# Patient Record
Sex: Female | Born: 1990 | Race: White | Hispanic: No | Marital: Single | Smoking: Current every day smoker
Health system: Southern US, Community
[De-identification: ages and names within clinical notes are randomized; demographics above are authoritative.]

## PROBLEM LIST (undated history)

## (undated) DIAGNOSIS — F319 Bipolar disorder, unspecified: Secondary | ICD-10-CM

## (undated) DIAGNOSIS — B1920 Unspecified viral hepatitis C without hepatic coma: Secondary | ICD-10-CM

## (undated) HISTORY — PX: OTHER SURGICAL HISTORY: SHX169

---

## 2011-01-02 ENCOUNTER — Emergency Department (INDEPENDENT_AMBULATORY_CARE_PROVIDER_SITE_OTHER): Payer: Medicaid Other

## 2011-01-02 ENCOUNTER — Emergency Department (HOSPITAL_BASED_OUTPATIENT_CLINIC_OR_DEPARTMENT_OTHER)
Admission: EM | Admit: 2011-01-02 | Discharge: 2011-01-02 | Disposition: A | Discharge status: Home / Self Care | Payer: Medicaid Other | Attending: Emergency Medicine | Admitting: Emergency Medicine

## 2011-01-02 DIAGNOSIS — F17200 Nicotine dependence, unspecified, uncomplicated: Secondary | ICD-10-CM | POA: Insufficient documentation

## 2011-01-02 DIAGNOSIS — F319 Bipolar disorder, unspecified: Secondary | ICD-10-CM | POA: Insufficient documentation

## 2011-01-02 DIAGNOSIS — S99919A Unspecified injury of unspecified ankle, initial encounter: Secondary | ICD-10-CM

## 2011-01-02 DIAGNOSIS — S93409A Sprain of unspecified ligament of unspecified ankle, initial encounter: Secondary | ICD-10-CM

## 2011-01-02 DIAGNOSIS — X500XXA Overexertion from strenuous movement or load, initial encounter: Secondary | ICD-10-CM | POA: Insufficient documentation

## 2011-01-02 DIAGNOSIS — X58XXXA Exposure to other specified factors, initial encounter: Secondary | ICD-10-CM

## 2011-01-02 DIAGNOSIS — S99929A Unspecified injury of unspecified foot, initial encounter: Secondary | ICD-10-CM

## 2011-01-02 HISTORY — DX: Bipolar disorder, unspecified: F31.9

## 2011-01-02 MED ORDER — OXYCODONE-ACETAMINOPHEN 5-325 MG PO TABS: 2.0000 | ORAL_TABLET | ORAL | Status: AC | PRN

## 2011-01-02 NOTE — ED Notes (Signed)
XR performed and awaiting MD evaluation.  No change in pt assessment.

## 2011-01-02 NOTE — ED Provider Notes (Signed)
History     Chief Complaint  Patient presents with  . Foot Injury  . Ankle Pain   Patient is a 20 y.o. female presenting with foot injury and ankle pain. The history is provided by the patient.  Foot Injury  The incident occurred 3 to 5 hours ago. The incident occurred at home. The injury mechanism was torsion. The pain is present in the right ankle and right foot. The quality of the pain is described as sharp and throbbing. The pain is moderate. The pain has been constant since onset. She reports no foreign bodies present. The symptoms are aggravated by nothing. She has tried nothing for the symptoms.  Ankle Pain    Pt reports she was playing in the yard with her children and she turned her ankle.  Pt complains of pain to lateral aspect of ankle.  Past Medical History  Diagnosis Date  . Bipolar 1 disorder     Past Surgical History  Procedure Date  . Laproscopy     History reviewed. No pertinent family history.  History  Substance Use Topics  . Smoking status: Current Everyday Smoker -- 0.5 packs/day  . Smokeless tobacco: Not on file  . Alcohol Use: Yes     occassional    OB History    Grav Para Term Preterm Abortions TAB SAB Ect Mult Living                  Review of Systems  Musculoskeletal: Positive for myalgias and joint swelling.  All other systems reviewed and are negative.    Physical Exam  BP 140/72  Pulse 72  Temp(Src) 97.8 F (36.6 C) (Oral)  Resp 16  Ht 5\' 7"  (1.702 m)  Wt 150 lb (68.04 kg)  BMI 23.49 kg/m2  SpO2 100%  LMP 11/14/2010  Physical Exam  Constitutional: She is oriented to person, place, and time. She appears well-developed and well-nourished.  HENT:  Head: Normocephalic.  Musculoskeletal: She exhibits edema and tenderness.  Neurological: She is alert and oriented to person, place, and time.  Skin: There is erythema.  Psychiatric: She has a normal mood and affect.    ED Course  Procedures  MDM Xray no fx.  Pt advised  followup with Dr. Pearletha Forge.        Langston Masker, [place] 01/02/11 [phone]

## 2011-01-02 NOTE — ED Notes (Signed)
Pt reports she was playing ball with her kids and "rolled" ger ankle.  Pain has increased in right foot and ankle throughout the day.

## 2011-02-14 NOTE — ED Provider Notes (Signed)
History     CSN: [id number] Arrival date & time: 01/02/2011  4:00 PM  Chief Complaint  Patient presents with  . Foot Injury  . Ankle Pain   HPI  Past Medical History  Diagnosis Date  . Bipolar 1 disorder     Past Surgical History  Procedure Date  . Laproscopy     History reviewed. No pertinent family history.  History  Substance Use Topics  . Smoking status: Current Everyday Smoker -- 0.5 packs/day  . Smokeless tobacco: Not on file  . Alcohol Use: Yes     occassional    OB History    Grav Para Term Preterm Abortions TAB SAB Ect Mult Living                  Review of Systems  Physical Exam  BP 140/72  Pulse 72  Temp(Src) 97.8 F (36.6 C) (Oral)  Resp 16  Ht 5\' 7"  (1.702 m)  Wt 150 lb (68.04 kg)  BMI 23.49 kg/m2  SpO2 100%  LMP 11/14/2010  Physical Exam  ED Course  Procedures  MDM Medical screening examination/treatment/procedure(s) were performed by non-physician practitioner and as supervising physician I was immediately available for consultation/collaboration.       Nat Christen, MD 02/14/11 [phone]

## 2013-02-21 IMAGING — CR DG ANKLE COMPLETE 3+V*R*
3 series · 3 of 3 positions shown · non-contrast
Comparison: None

CLINICAL DATA: Injured right ankle.

RIGHT ANKLE - COMPLETE 3+ VIEW

[t ankle joint lat right]
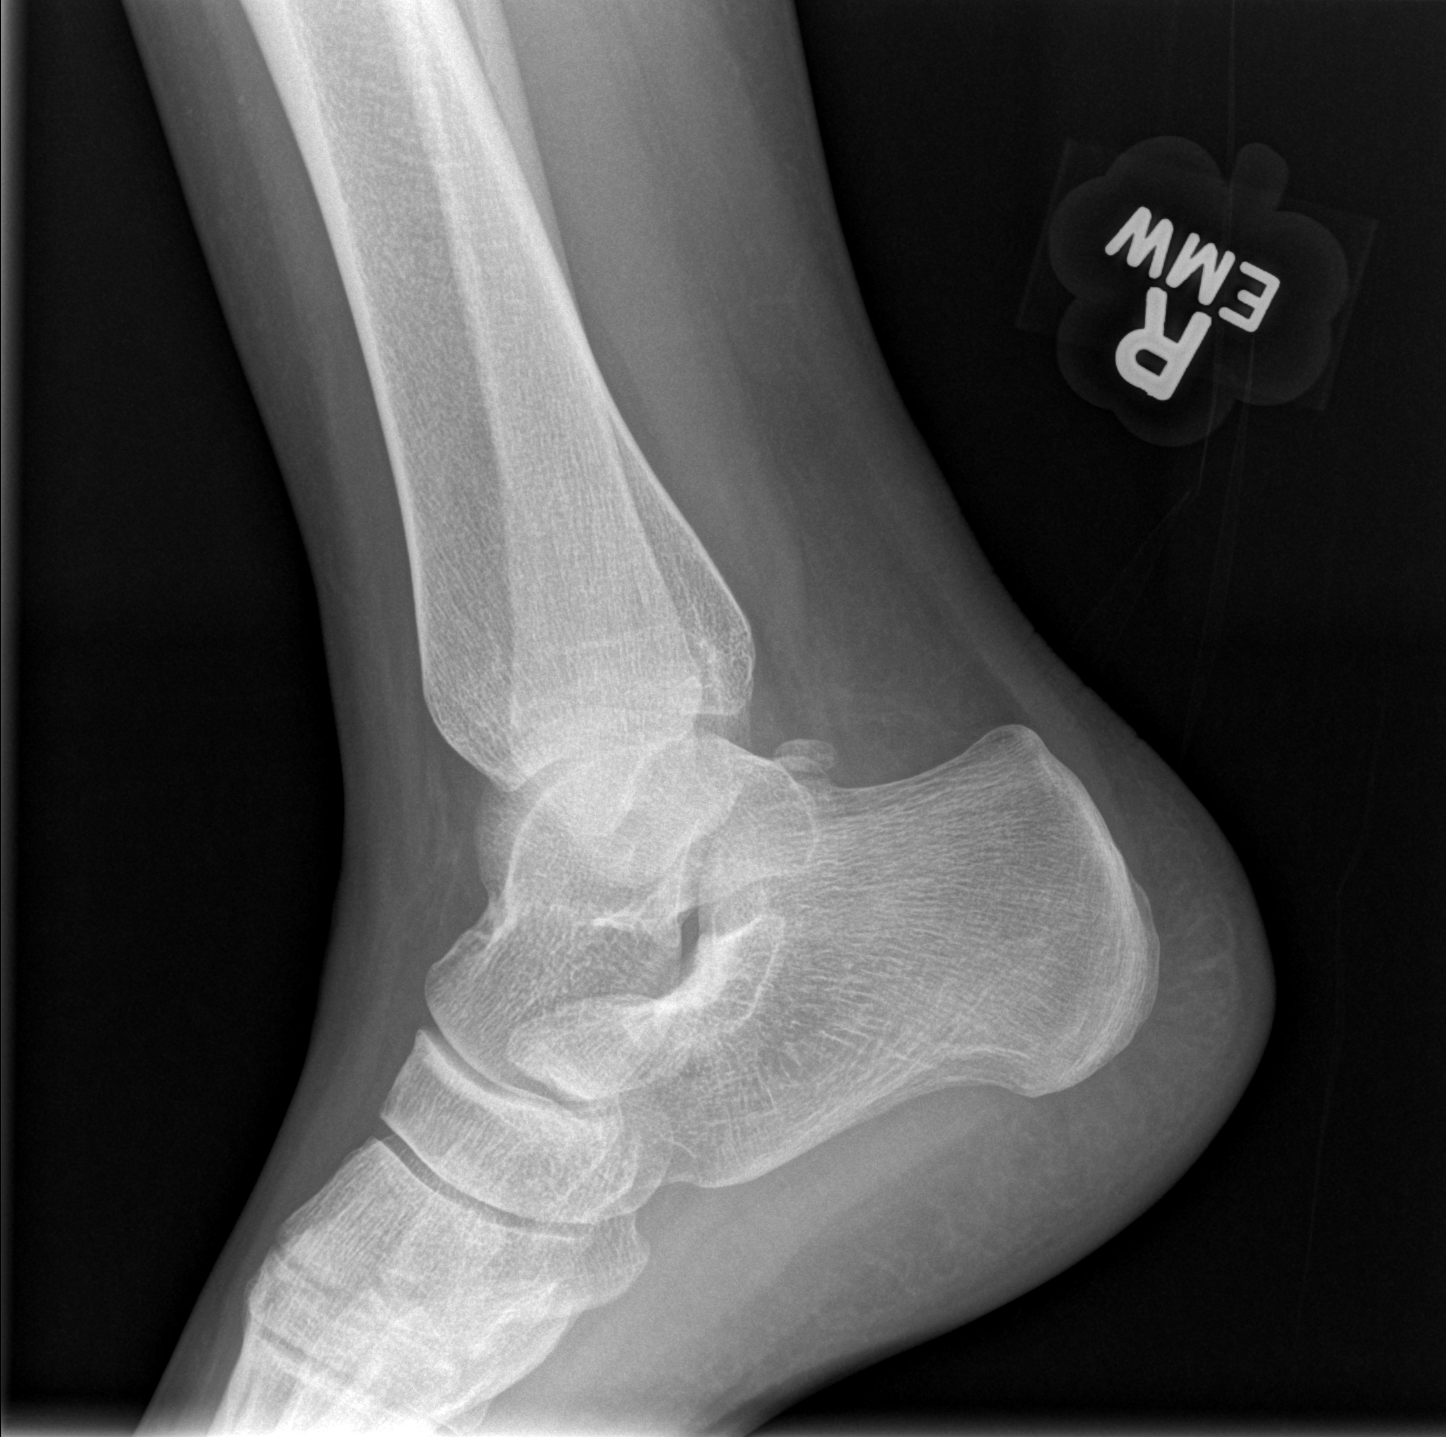

[t ankle joint ap right]
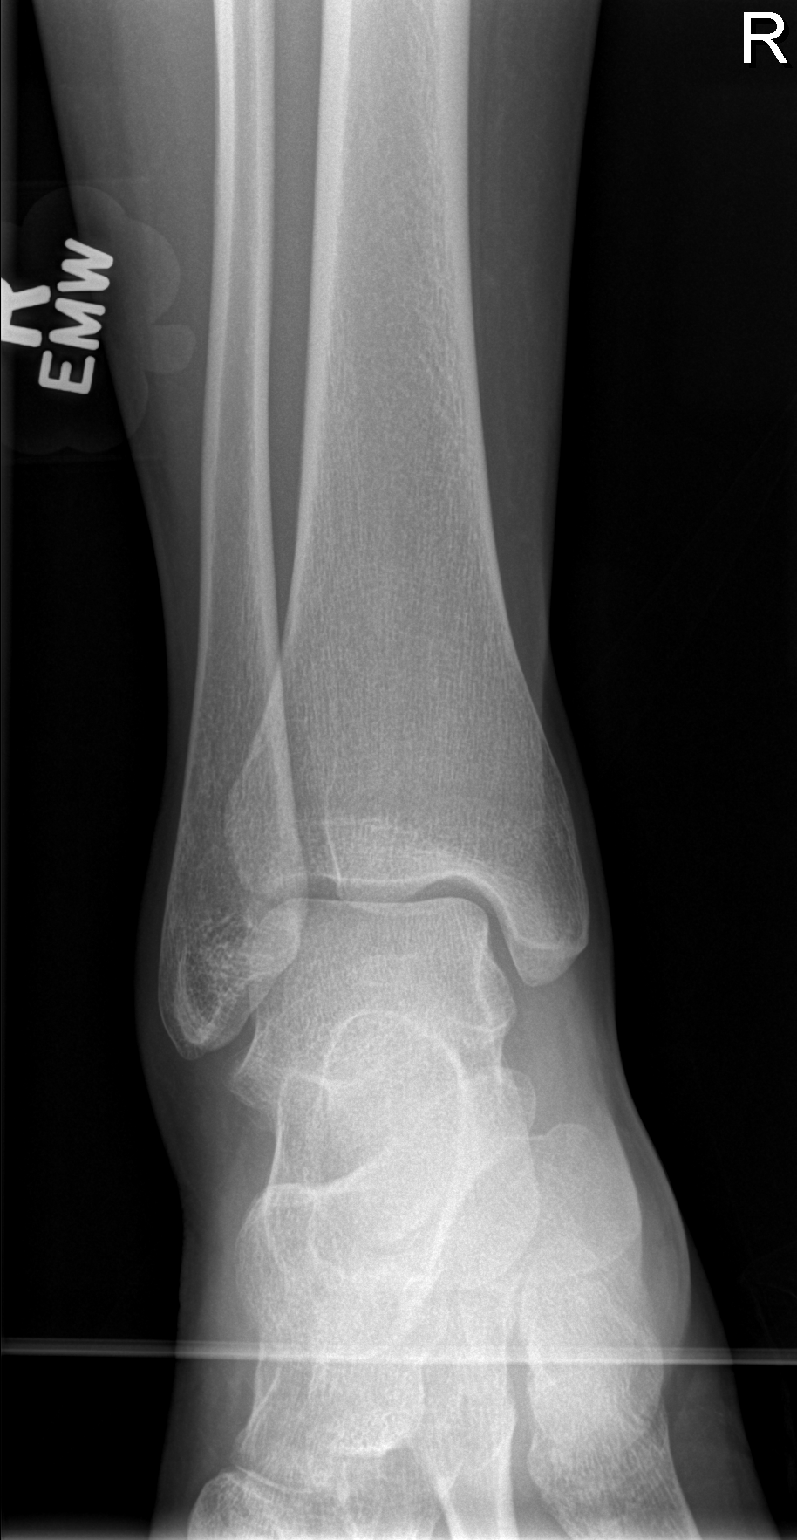

[t ankle joint oblique right]
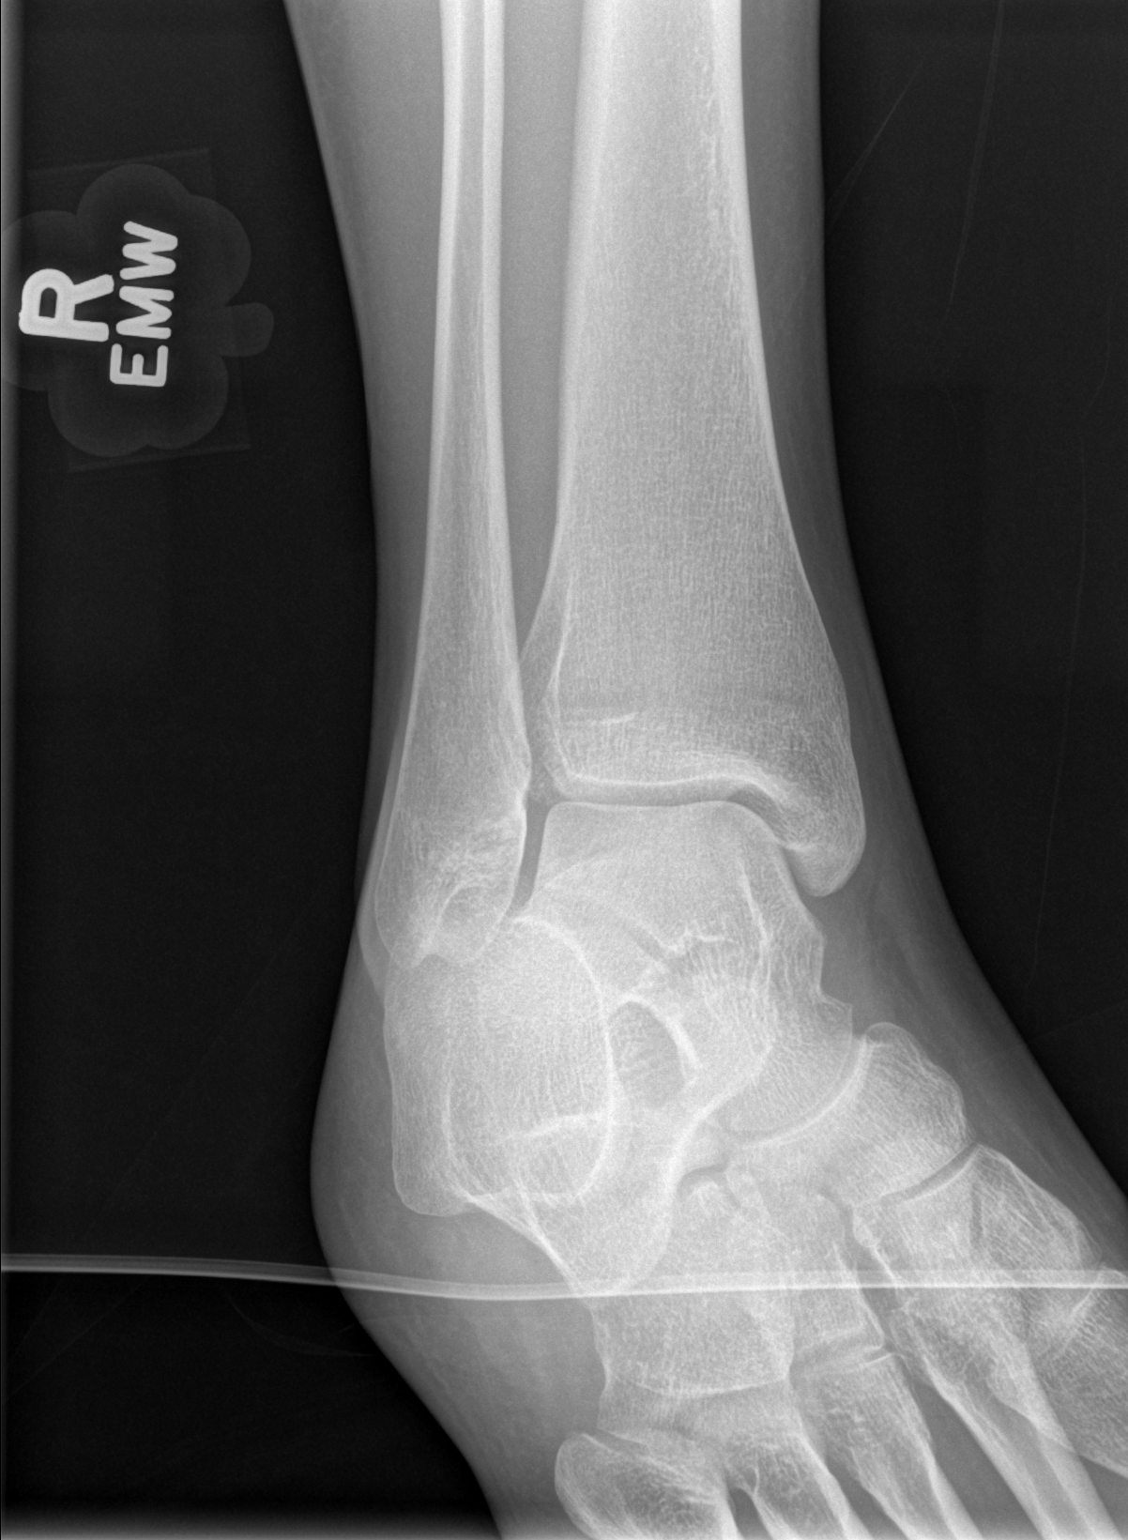

[3 of 3 positions shown; findings below may reference images not displayed]

FINDINGS: The ankle mortise is maintained.  No acute fracture or
osteochondral abnormality.
IMPRESSION: No acute bony findings.

## 2013-02-21 IMAGING — CR DG FOOT COMPLETE 3+V*R*
3 series · 3 of 3 positions shown · non-contrast
Comparison: None

CLINICAL DATA: Injured right foot.

RIGHT FOOT COMPLETE - 3+ VIEW

[t foot ap right]
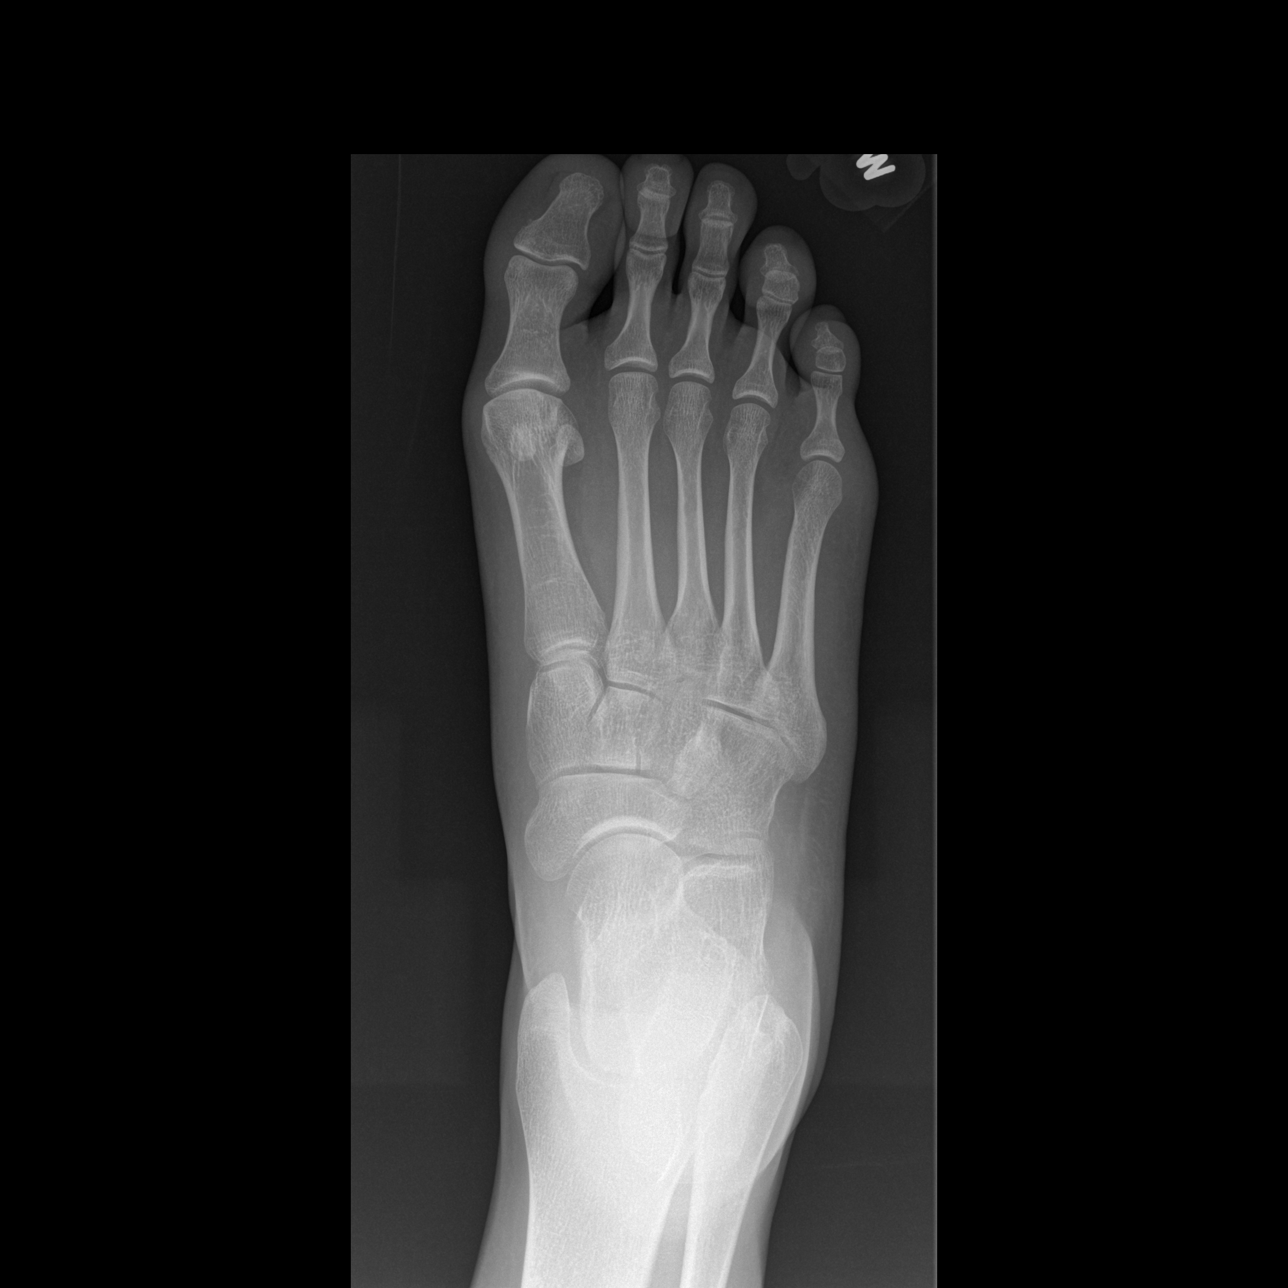

[t foot oblique right]
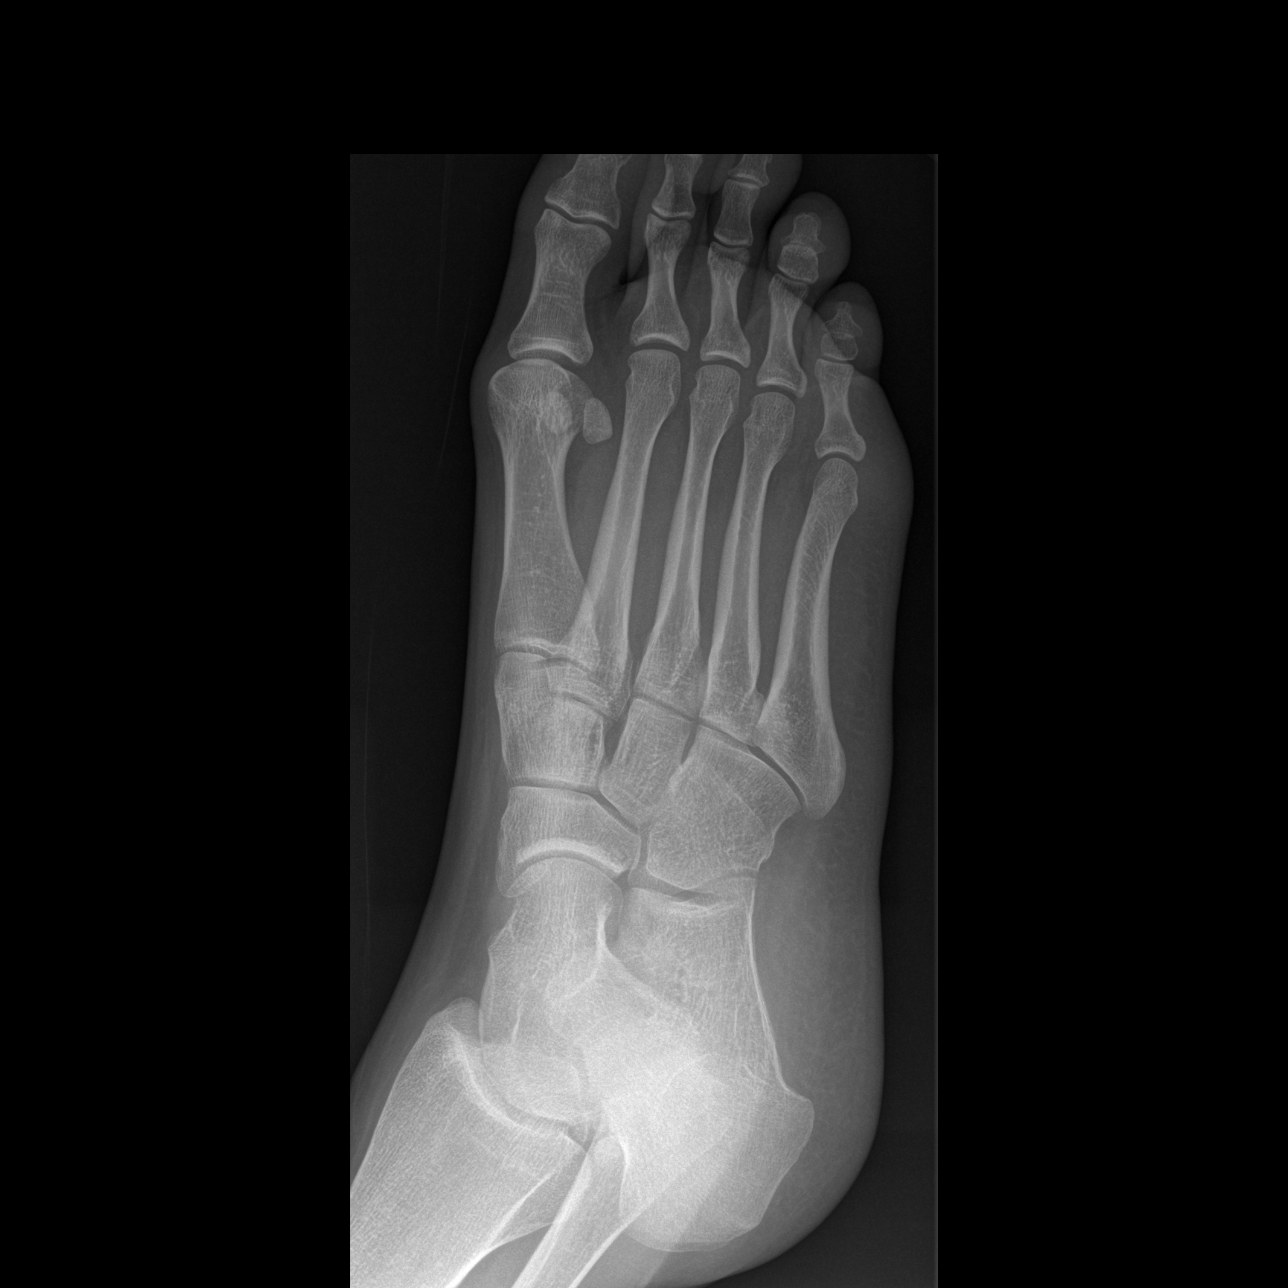

[t foot lat right]
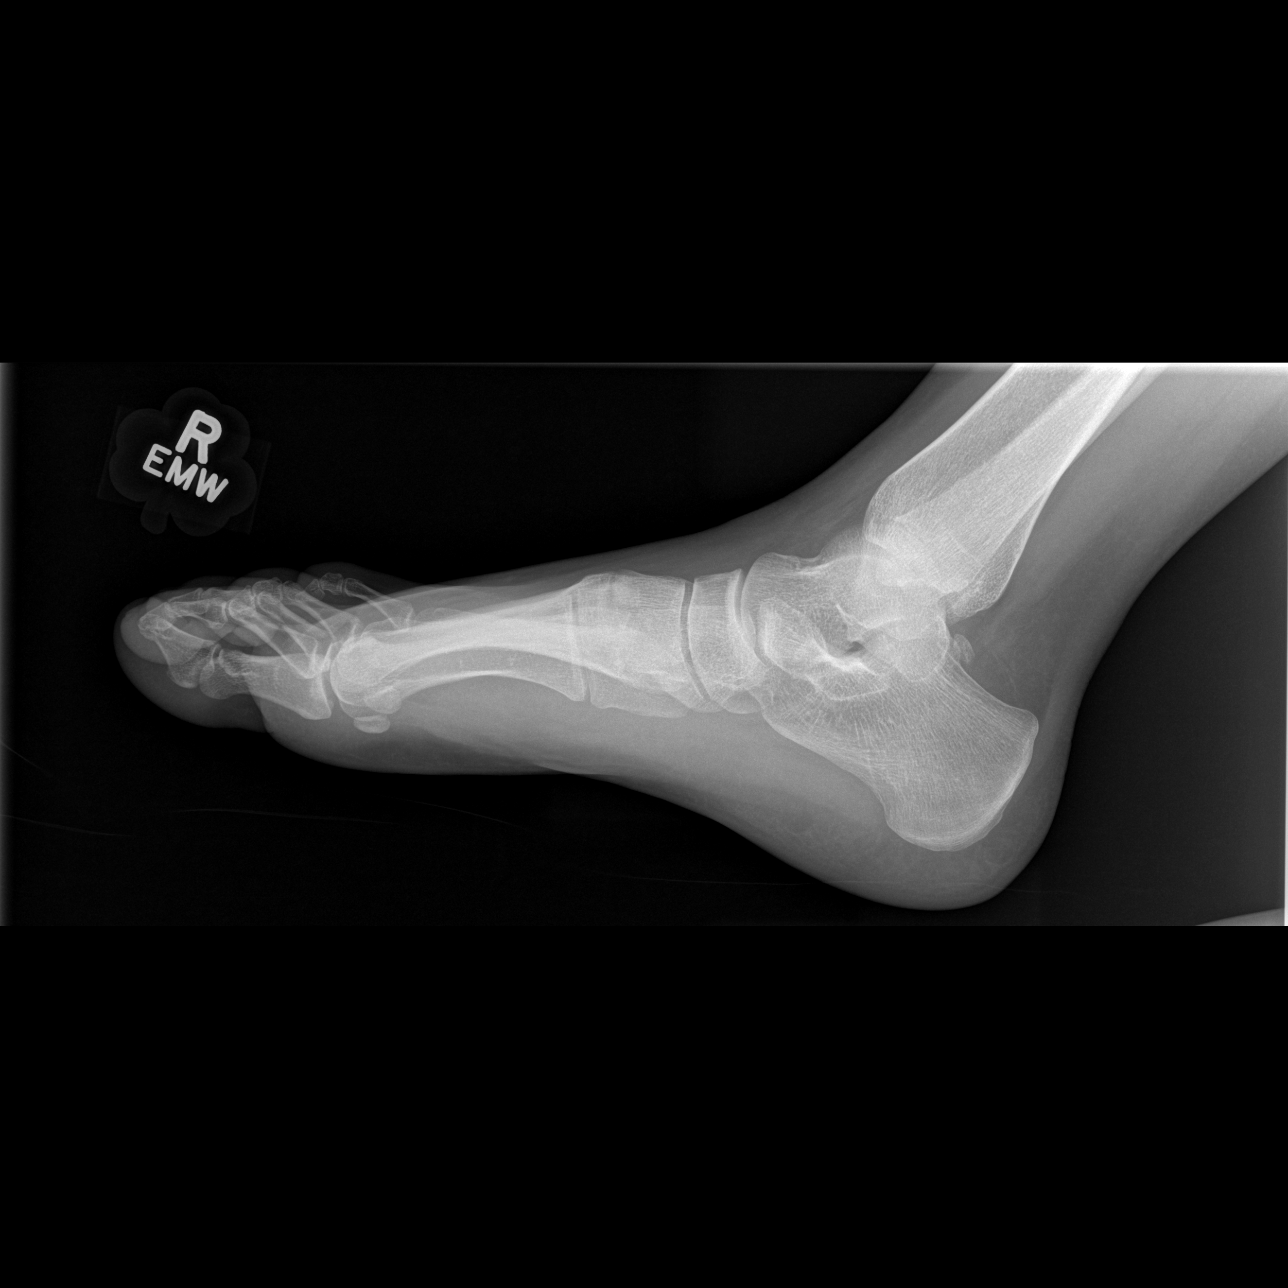

[3 of 3 positions shown; findings below may reference images not displayed]

FINDINGS: The joint spaces are maintained.  No acute fracture.
IMPRESSION: No acute bony findings.

## 2018-01-28 ENCOUNTER — Encounter (HOSPITAL_BASED_OUTPATIENT_CLINIC_OR_DEPARTMENT_OTHER): Payer: Self-pay | Admitting: Emergency Medicine

## 2018-01-28 ENCOUNTER — Emergency Department (HOSPITAL_BASED_OUTPATIENT_CLINIC_OR_DEPARTMENT_OTHER)
Admission: EM | Admit: 2018-01-28 | Discharge: 2018-01-28 | Disposition: A | Discharge status: Home / Self Care | Payer: Self-pay | Attending: Emergency Medicine | Admitting: Emergency Medicine

## 2018-01-28 ENCOUNTER — Other Ambulatory Visit: Payer: Self-pay

## 2018-01-28 DIAGNOSIS — Z79899 Other long term (current) drug therapy: Secondary | ICD-10-CM | POA: Insufficient documentation

## 2018-01-28 DIAGNOSIS — F17210 Nicotine dependence, cigarettes, uncomplicated: Secondary | ICD-10-CM | POA: Insufficient documentation

## 2018-01-28 DIAGNOSIS — R1031 Right lower quadrant pain: Secondary | ICD-10-CM | POA: Insufficient documentation

## 2018-01-28 HISTORY — DX: Unspecified viral hepatitis C without hepatic coma: B19.20

## 2018-01-28 NOTE — ED Provider Notes (Signed)
[facility] EMERGENCY DEPARTMENT Provider Note   CSN: [id number] Arrival date & time: 01/28/18  [id number]     History   Chief Complaint Chief Complaint  Patient presents with  . Vaginal Discharge    HPI Zoe Lucas is a 27 y.o. female.  HPI Patient presents with right-sided lower abdominal pain for unspecified period of time.  States the pain comes and goes.  Not associated with food.  Associates this with previous diagnosis of ovarian cyst.  Also complains of foul-smelling vaginal discharge for unspecified period of time.  She is sexually active but uses condoms.  Has not had a period in over a year.  States she was recently incarcerated.  Denies urinary symptoms.  No vomiting or diarrhea. Past Medical History:  Diagnosis Date  . Bipolar 1 disorder (HCC)   . Hepatitis C     There are no active problems to display for this patient.   Past Surgical History:  Procedure Laterality Date  . laproscopy       OB History   None      Home Medications    Prior to Admission medications   Medication Sig Start Date End Date Taking? Authorizing Provider  ALPRAZolam Prudy Feeler(XANAX) 1 MG tablet Take 1 mg by mouth at bedtime as needed.      [provider]  citalopram (CELEXA) 10 MG tablet Take 10 mg by mouth daily.      [provider]  lamoTRIgine (LAMICTAL) 100 MG tablet Take 100 mg by mouth daily.      [provider]    Family History History reviewed. No pertinent family history.  Social History Social History   Tobacco Use  . Smoking status: Current Every Day Smoker    Packs/day: 0.50    Types: Cigarettes  . Smokeless tobacco: Never Used  Substance Use Topics  . Alcohol use: Yes    Comment: occassional  . 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  HENT: Negative for trouble swallowing.     Respiratory: Negative for cough and shortness of breath.   Cardiovascular: Negative for chest pain.  Gastrointestinal: Positive for abdominal pain. Negative for constipation, diarrhea, nausea and vomiting.  Genitourinary: Positive for pelvic pain and vaginal discharge. Negative for dysuria, flank pain, frequency and vaginal bleeding.  Musculoskeletal: Negative for back pain, myalgias and neck pain.  Skin: Negative for rash and wound.  Neurological: Negative for dizziness, weakness, light-headedness, numbness and headaches.  All other systems reviewed and are negative.    Physical Exam Updated Vital Signs BP 135/90 (BP Location: Right Arm)   Pulse 92   Temp 97.9 F (36.6 C) (Oral)   Resp 16   Ht 5\' 7"  (1.702 m)   Wt 72.6 kg   LMP 01/28/2017   SpO2 94%   BMI 25.06 kg/m   Physical Exam  Constitutional: She is oriented to person, place, and time. She appears well-developed and well-nourished. No distress.  HENT:  Head: Normocephalic and atraumatic.  Mouth/Throat: Oropharynx is clear and moist. No oropharyngeal exudate.  Eyes: Pupils are equal, round, and reactive to light. EOM are normal.  Neck: Normal range of motion. Neck supple. No JVD present.  Cardiovascular: Normal rate and regular rhythm. Exam reveals no gallop and no friction rub.  No murmur heard. Pulmonary/Chest: Effort normal and breath sounds  normal. No stridor. No respiratory distress. She has no wheezes. She has no rales. She exhibits no tenderness.  Abdominal: Soft. Bowel sounds are normal. There is tenderness. There is no rebound and no guarding.  Patient with mild right lower quadrant tenderness to palpation.  No definite rebound or guarding.  Musculoskeletal: Normal range of motion. She exhibits no edema or tenderness.  No CVA tenderness bilaterally.  No midline thoracic or lumbar tenderness.  Lymphadenopathy:    She has no cervical adenopathy.  Neurological: She is alert and oriented to person, place, and  time.  Moves all extremities without focal deficit.  Sensation intact.  Skin: Skin is warm and dry. No rash noted. She is not diaphoretic. No erythema.  Scattered bruising bilateral upper extremities.  Thickened skin, dry skin to right foot around the nailbeds.  Psychiatric: She has a normal mood and affect. Her behavior is normal.  Nursing note and vitals reviewed.    ED Treatments / Results  Labs (all labs ordered are listed, but only abnormal results are displayed) Labs Reviewed  WET PREP, GENITAL  URINALYSIS, ROUTINE W REFLEX MICROSCOPIC  PREGNANCY, URINE  GC/CHLAMYDIA PROBE AMP ([place]    EKG None  Radiology No results found.  Procedures Procedures (including critical care time)  Medications Ordered in ED Medications - No data to display   Initial Impression / Assessment and Plan / ED Course  I have reviewed the triage vital signs and the nursing notes.  Pertinent labs & imaging results that were available during my care of the patient were reviewed by me and considered in my medical decision making (see chart for details).     Patient left emergency department prior to complete evaluation.  Was unable to give return precautions prior to patient leaving the emergency department.  Final Clinical Impressions(s) / ED Diagnoses   Final diagnoses:  Right lower quadrant abdominal pain    ED Discharge Orders    None       Loren RacerYelverton, Winslow Ederer, MD 01/28/18 [phone]

## 2018-01-28 NOTE — ED Notes (Signed)
Pt is requesting a note to confirm that she came to the ED today as the patient was to appear in court.  Informed pt that we give out notes at discharge.  Pt insisting that her lawyer told her to get a note now and fax it to the court house.  Again informed the patient that we can give a note at discharge but not at the start of the visit.

## 2018-01-28 NOTE — ED Triage Notes (Signed)
Pt having lower pelvic pain with vaginal discharge for a few days.  Some chills and sweats.

## 2018-01-28 NOTE — ED Notes (Signed)
Pt states she needs to leave or she will be arrested.  Requesting discharge papers.  Informed pt that she had not received any treatment.  Pt arguing that she came for treatment, that she wants treatment but cant stay.  She continues to want documentation of her visit.  Informed pt that she would need to stay to complete care for discharge instructions.  Pt continues to argue about having some documentation, that she wants treatment but has to leave, pt mentioned that she would go to another facility for treatment and documentation of visit.  Pt not willing to stay for further medical care and wishes to sign out AMA.  Md notified.

## 2018-01-28 NOTE — ED Notes (Signed)
Pt requesting copy of AMA signature page,  Printed and given to patient.

## 2018-01-31 ENCOUNTER — Encounter (HOSPITAL_COMMUNITY): Payer: Self-pay | Admitting: Emergency Medicine

## 2018-01-31 ENCOUNTER — Emergency Department (HOSPITAL_COMMUNITY)
Admission: EM | Admit: 2018-01-31 | Discharge: 2018-01-31 | Disposition: A | Discharge status: Home / Self Care | Payer: Self-pay | Attending: Emergency Medicine | Admitting: Emergency Medicine

## 2018-01-31 DIAGNOSIS — R1030 Lower abdominal pain, unspecified: Secondary | ICD-10-CM | POA: Insufficient documentation

## 2018-01-31 DIAGNOSIS — Z5321 Procedure and treatment not carried out due to patient leaving prior to being seen by health care provider: Secondary | ICD-10-CM | POA: Insufficient documentation

## 2018-01-31 DIAGNOSIS — N898 Other specified noninflammatory disorders of vagina: Secondary | ICD-10-CM | POA: Insufficient documentation

## 2018-01-31 NOTE — ED Notes (Signed)
Called Pt in lobby for vital recheck, no response in lobby x2. 

## 2018-01-31 NOTE — ED Notes (Signed)
Called Pt in lobby for vital recheck, no response in lobby x1. 

## 2018-01-31 NOTE — ED Triage Notes (Signed)
Pt c/o lower abd pains with vaginal odor and discharge x 3 weeks.

## 2018-01-31 NOTE — ED Notes (Signed)
Called Pt in lobby for vital recheck, no response in lobby x3.
